# Patient Record
Sex: Female | Born: 1965 | Race: White | Hispanic: No | Marital: Single | Smoking: Never smoker
Health system: Southern US, Community
[De-identification: ages and names within clinical notes are randomized; demographics above are authoritative.]

## PROBLEM LIST (undated history)

## (undated) HISTORY — PX: APPENDECTOMY: SHX54

## (undated) HISTORY — PX: CHOLECYSTECTOMY: SHX55

## (undated) HISTORY — PX: BACK SURGERY: SHX140

---

## 2016-02-27 ENCOUNTER — Emergency Department (HOSPITAL_BASED_OUTPATIENT_CLINIC_OR_DEPARTMENT_OTHER): Payer: BLUE CROSS/BLUE SHIELD

## 2016-02-27 ENCOUNTER — Encounter (HOSPITAL_BASED_OUTPATIENT_CLINIC_OR_DEPARTMENT_OTHER): Payer: Self-pay | Admitting: Emergency Medicine

## 2016-02-27 ENCOUNTER — Emergency Department (HOSPITAL_BASED_OUTPATIENT_CLINIC_OR_DEPARTMENT_OTHER)
Admission: EM | Admit: 2016-02-27 | Discharge: 2016-02-27 | Disposition: A | Discharge status: Home / Self Care | Payer: BLUE CROSS/BLUE SHIELD | Attending: Emergency Medicine | Admitting: Emergency Medicine

## 2016-02-27 DIAGNOSIS — S61212A Laceration without foreign body of right middle finger without damage to nail, initial encounter: Secondary | ICD-10-CM | POA: Diagnosis not present

## 2016-02-27 DIAGNOSIS — S51832A Puncture wound without foreign body of left forearm, initial encounter: Secondary | ICD-10-CM | POA: Insufficient documentation

## 2016-02-27 DIAGNOSIS — Y999 Unspecified external cause status: Secondary | ICD-10-CM | POA: Insufficient documentation

## 2016-02-27 DIAGNOSIS — S51831A Puncture wound without foreign body of right forearm, initial encounter: Secondary | ICD-10-CM | POA: Insufficient documentation

## 2016-02-27 DIAGNOSIS — S61451A Open bite of right hand, initial encounter: Secondary | ICD-10-CM | POA: Diagnosis present

## 2016-02-27 DIAGNOSIS — Z23 Encounter for immunization: Secondary | ICD-10-CM | POA: Insufficient documentation

## 2016-02-27 DIAGNOSIS — W540XXA Bitten by dog, initial encounter: Secondary | ICD-10-CM | POA: Diagnosis not present

## 2016-02-27 DIAGNOSIS — T148XXA Other injury of unspecified body region, initial encounter: Secondary | ICD-10-CM

## 2016-02-27 DIAGNOSIS — Y929 Unspecified place or not applicable: Secondary | ICD-10-CM | POA: Insufficient documentation

## 2016-02-27 DIAGNOSIS — S80212A Abrasion, left knee, initial encounter: Secondary | ICD-10-CM | POA: Insufficient documentation

## 2016-02-27 DIAGNOSIS — Y9301 Activity, walking, marching and hiking: Secondary | ICD-10-CM | POA: Diagnosis not present

## 2016-02-27 DIAGNOSIS — S61219A Laceration without foreign body of unspecified finger without damage to nail, initial encounter: Secondary | ICD-10-CM

## 2016-02-27 MED ORDER — IBUPROFEN 800 MG PO TABS
800.0000 mg | ORAL_TABLET | Freq: Once | ORAL | Status: AC | PRN
  Administered 2016-02-27: 800 mg via ORAL
  Filled 2016-02-27: qty 1

## 2016-02-27 MED ORDER — TETANUS-DIPHTH-ACELL PERTUSSIS 5-2.5-18.5 LF-MCG/0.5 IM SUSP
0.5000 mL | Freq: Once | INTRAMUSCULAR | Status: AC
  Administered 2016-02-27: 0.5 mL via INTRAMUSCULAR
  Filled 2016-02-27: qty 0.5

## 2016-02-27 MED ORDER — LIDOCAINE HCL 2 % IJ SOLN
20.0000 mL | Freq: Once | INTRAMUSCULAR | Status: AC
  Administered 2016-02-27: 400 mg via INTRADERMAL
  Filled 2016-02-27: qty 20

## 2016-02-27 MED ORDER — AMOXICILLIN-POT CLAVULANATE 875-125 MG PO TABS
1.0000 | ORAL_TABLET | Freq: Once | ORAL | Status: AC
  Administered 2016-02-27: 1 via ORAL
  Filled 2016-02-27: qty 1

## 2016-02-27 MED ORDER — AMOXICILLIN-POT CLAVULANATE 875-125 MG PO TABS: 1.0000 | ORAL_TABLET | Freq: Two times a day (BID) | ORAL | Status: DC

## 2016-02-27 MED ORDER — TRAMADOL HCL 50 MG PO TABS: 50.0000 mg | ORAL_TABLET | Freq: Four times a day (QID) | ORAL | Status: DC | PRN

## 2016-02-27 NOTE — ED Notes (Signed)
HPPD at bedside for dogbite.

## 2016-02-27 NOTE — ED Notes (Signed)
Patient states that she was walking her dog and another dog attacked it. She jumped on top of her dog and the other dog attached her. The patient has multiple areas of injury

## 2016-02-27 NOTE — ED Notes (Signed)
All abrasions to BUE and B knee cleaned and dressed with bacitracin ointment and sterile 4x4's secured with coban. The rt middle finger and rt ring finger left open to air.

## 2016-02-27 NOTE — Discharge Instructions (Signed)
Please read and follow all provided instructions.  Your diagnoses today include:  1. Dog bite   2. Finger laceration, initial encounter     Tests performed today include:  X-ray of the affected area that did not show any foreign bodies or broken bones  Vital signs. See below for your results today.   Medications prescribed:   Augmentin - antibiotic  You have been prescribed an antibiotic medicine: take the entire course of medicine even if you are feeling better. Stopping early can cause the antibiotic not to work.  Take any prescribed medications only as directed.   Home care instructions:  Follow any educational materials and wound care instructions contained in this packet.   Keep affected area above the level of your heart when possible to minimize swelling. Wash area gently twice a day with warm soapy water. Do not apply alcohol or hydrogen peroxide. Cover the area if it draining or weeping.   Follow-up instructions: Suture Removal: Return to the Emergency Department or see your primary care care doctor in 7 days for a recheck of your wound and removal of your sutures or staples.    Return instructions:  Return to the Emergency Department if you have:  Fever  Worsening pain  Worsening swelling of the wound  Pus draining from the wound  Redness of the skin that moves away from the wound, especially if it streaks away from the affected area   Any other emergent concerns  Your vital signs today were: BP 146/87 mmHg   Pulse 78   Temp(Src) 97.7 F (36.5 C) (Oral)   Resp 18   Ht 5\' 5"  (1.651 m)   Wt 74.844 kg   BMI 27.46 kg/m2   SpO2 100%   LMP 02/01/2016 If your blood pressure (BP) was elevated above 135/85 this visit, please have this repeated by your doctor within one month. --------------

## 2016-02-27 NOTE — ED Notes (Signed)
Pt given d/c instructions as per chart. Verbalizes understanding. No questions. Rx x 2. 

## 2016-02-27 NOTE — ED Provider Notes (Signed)
CSN: [id number]     Arrival date & time 02/27/16  2016 History   First MD Initiated Contact with Patient 02/27/16 2116     Chief Complaint  Patient presents with  . Animal Bite     (Consider location/radiation/quality/duration/timing/severity/associated sxs/prior Treatment) HPI Comments: Patient presents with chief complaint of dog bite occurring just prior to arrival. Patient was walking her dog when another dog attacked her dog. Patient was bitten and scratched by the attacking dog when she tried to defend her own animal. Patient sustained abrasions to her knees when she scraped on the ground. She has puncture wounds to her bilateral forearms and a laceration of the tip of her right long finger. Tetanus greater than 5 years ago. No treatments prior to arrival. Dog is known and is up-to-date on its immunizations.  The history is provided by the patient.    History reviewed. No pertinent past medical history. Past Surgical History  Procedure Laterality Date  . Back surgery    . Cholecystectomy    . Appendectomy     History reviewed. No pertinent family history. Social History  Substance Use Topics  . Smoking status: Never Smoker   . Smokeless tobacco: None  . Alcohol Use: No   OB History    No data available     Review of Systems  Constitutional: Negative for activity change.  Musculoskeletal: Positive for myalgias and arthralgias. Negative for back pain, joint swelling and neck pain.  Skin: Positive for wound.  Neurological: Negative for weakness and numbness.   Allergies  Penicillins  Home Medications   Prior to Admission medications   Not on File   BP 146/85 mmHg  Pulse 100  Temp(Src) 98.8 F (37.1 C) (Oral)  Resp 18  Ht 5\' 5"  (1.651 m)  Wt 74.844 kg  BMI 27.46 kg/m2  SpO2 100%  LMP 02/01/2016   Physical Exam  Constitutional: She appears well-developed and well-nourished.  HENT:  Head: Normocephalic and atraumatic.  Eyes: Conjunctivae are normal.   Neck: Normal range of motion. Neck supple.  Pulmonary/Chest: No respiratory distress.  Neurological: She is alert.  Skin: Skin is warm and dry.  Patient with several small puncture wounds noted to the bilateral forearms. Unable to visualize the base of these wounds they are punctures. No active bleeding. They appear clean.  Abrasion noted to the left knee. Appears clean.   There is a 1 cm flap laceration noted to the pad of the right long finger.  Psychiatric: She has a normal mood and affect.  Nursing note and vitals reviewed.   ED Course  Procedures (including critical care time) Imaging Review Dg Hand Complete Right  02/27/2016  CLINICAL DATA:  Dog bite on right hand, with wound at the distal tip of the third phalanx. Initial encounter. EXAM: RIGHT HAND - COMPLETE 3+ VIEW COMPARISON:  None. FINDINGS: The known soft tissue laceration is not well characterized on radiograph. No radiopaque foreign bodies are seen. There is no evidence of osseous disruption. Visualized joint spaces are preserved. The carpal rows appear grossly intact, and demonstrate normal alignment. IMPRESSION: No evidence of fracture or dislocation. No radiopaque foreign bodies seen. Electronically Signed   By: Roanna RaiderJeffery  Chang M.D.   On: 02/27/2016 22:00   I have personally reviewed and evaluated these images and lab results as part of my medical decision-making.  Patient seen and examined. Wound is well cleaned and irrigated by nursing. X-ray ordered. Augmentin ordered. TD Ordered.   Vital signs reviewed and are  as follows: BP 146/87 mmHg  Pulse 78  Temp(Src) 97.7 F (36.5 C) (Oral)  Resp 18  Ht  (1.651 m)  Wt 74.844 kg  BMI 27.46 kg/m2  SpO2 100%  LMP 02/01/2016  Discussed with patient that finger wound likely should be repaired given gaping nature wound. We discussed risk of wound infection with animal bites as well. Augmentin given in emergency department without any allergic reaction. Tdap updated.  Patient agrees to proceed with wound repair.  Digital block performed.  Technique: 3-sided ring block Finger: R long Area prepped with alcohol wipe and iodine Medication: 3mL of 2% lidocaine without epinephrine Patient tolerated procedure well. Adequate anesthesia achieved.   LACERATION REPAIR Performed by: Carolee Rota Authorized by: Carolee Rota Consent: Verbal consent obtained. Risks and benefits: risks, benefits and alternatives were discussed Consent given by: patient Patient identity confirmed: provided demographic data Prepped and Draped in normal sterile fashion Wound explored  Laceration Location: Right long digit  Laceration Length: 1 cm  No Foreign Bodies seen or palpated  Anesthesia: Digital block, see above   Irrigation method: Scrub with dermal cleanser  Amount of cleaning: standard  Skin closure: 5-0 plain gut   Number of sutures: 4  Technique: Simple interrupted   Patient tolerance: Patient tolerated the procedure well with no immediate complications.  Patient counseled on wound care. Patient counseled on need to return or see PCP/urgent care for suture removal in 7 days. Patient was urged to return to the Emergency Department urgently with worsening pain, swelling, expanding erythema especially if it streaks away from the affected area, fever, or if they have any other concerns. Patient verbalized understanding.    MDM   Final diagnoses:  Finger laceration, initial encounter  Animal bite   Animal bite: Multiple small puncture wounds, cleaned. Hemostasis achieved. No wound closure as these are small punctures due to animal bite. No foreign bodies palpated. Finger laceration closed as pad was poorly approximated. This is not a puncture. Wound thoroughly cleaned prior to closure. Patient started on Augmentin. Also T-dap updated. Patient also has clean abrasions. We discussed good wound care and signs and symptoms to return. Patient seems reliable to  return with worsening. Dog is known and up-to-date on rabies.    Renne Crigler, PA-C 02/28/16 [id number]  Rolan Bucco, MD 02/28/16 [phone]

## 2016-03-04 ENCOUNTER — Emergency Department (HOSPITAL_BASED_OUTPATIENT_CLINIC_OR_DEPARTMENT_OTHER)
Admission: EM | Admit: 2016-03-04 | Discharge: 2016-03-04 | Disposition: A | Discharge status: Home / Self Care | Payer: BLUE CROSS/BLUE SHIELD | Attending: Emergency Medicine | Admitting: Emergency Medicine

## 2016-03-04 ENCOUNTER — Encounter (HOSPITAL_BASED_OUTPATIENT_CLINIC_OR_DEPARTMENT_OTHER): Payer: Self-pay | Admitting: *Deleted

## 2016-03-04 DIAGNOSIS — Z4802 Encounter for removal of sutures: Secondary | ICD-10-CM | POA: Diagnosis not present

## 2016-03-04 NOTE — ED Provider Notes (Signed)
CSN: [id number]     Arrival date & time 03/04/16  0711 History   First MD Initiated Contact with Patient 03/04/16 0720     No chief complaint on file.    (Consider location/radiation/quality/duration/timing/severity/associated sxs/prior Treatment) HPI Comments: The patient is a 50 year old female, she presents to have her sutures removed in her finger, right hand, occurred approximately 7 days ago when she was bitten by a dog. She has finished her Augmentin, she has some swelling of her right mid forearm but this has been chronically swollen since the dog bite. She denies any worsening of the swelling, denies any fevers, states she is doing well.  The history is provided by the patient.    No past medical history on file. Past Surgical History  Procedure Laterality Date  . Back surgery    . Cholecystectomy    . Appendectomy     No family history on file. Social History  Substance Use Topics  . Smoking status: Never Smoker   . Smokeless tobacco: Not on file  . Alcohol Use: No   OB History    No data available     Review of Systems  Constitutional: Negative for fever.  Skin: Positive for wound.      Allergies  Penicillins  Home Medications   Prior to Admission medications   Medication Sig Start Date End Date Taking? Authorizing Provider  amoxicillin-clavulanate (AUGMENTIN) 875-125 MG tablet Take 1 tablet by mouth 2 (two) times daily. 02/27/16   Renne CriglerJoshua Geiple, PA-C  traMADol (ULTRAM) 50 MG tablet Take 1 tablet (50 mg total) by mouth every 6 (six) hours as needed. 02/27/16   Renne CriglerJoshua Geiple, PA-C   BP 120/82 mmHg  Pulse 70  Temp(Src) 98.7 F (37.1 C) (Oral)  Resp 18  Ht 5\' 5"  (1.651 m)  Wt 165 lb (74.844 kg)  BMI 27.46 kg/m2  SpO2 100%  LMP 02/01/2016 Physical Exam  Constitutional: She appears well-developed and well-nourished.  HENT:  Head: Normocephalic and atraumatic.  Eyes: Conjunctivae are normal. Right eye exhibits no discharge. Left eye exhibits no  discharge.  Pulmonary/Chest: Effort normal. No respiratory distress.  Musculoskeletal:  Right forearm with minimal tenderness, bruising present to the right mid volar forearm, normal range of motion of all fingers, sutures present in the tip of the finger of the right hand  Neurological: She is alert. Coordination normal.  Skin: Skin is warm and dry. No rash noted. She is not diaphoretic. No erythema.  Psychiatric: She has a normal mood and affect.  Nursing note and vitals reviewed.   ED Course  Procedures (including critical care time) Labs Review Labs Reviewed - No data to display  Imaging Review No results found. I have personally reviewed and evaluated these images and lab results as part of my medical decision-making.   EKG Interpretation None      MDM   Final diagnoses:  Visit for suture removal    Sutures removed, vital signs stable, no signs of infection, encouraged good wound care, ice packs for swelling, follow-up as needed, patient expressed understanding.    Eber HongBrian Aiya Keach, MD 03/04/16 [phone]

## 2016-03-04 NOTE — ED Notes (Signed)
MD at bedside. 

## 2016-03-04 NOTE — ED Notes (Signed)
Sutures removed by EDP. 

## 2016-03-04 NOTE — Discharge Instructions (Signed)
Tylenol or motrin for pain - Ice packs for swelling Keep your wound dressed and clean when playing tennis

## 2016-03-04 NOTE — ED Notes (Signed)
Rt forearm area has swelling and discoloration, assessed also by EDP

## 2016-03-04 NOTE — ED Notes (Signed)
Here for wound ck, post dog bite and also for suture removal

## 2016-03-04 NOTE — ED Notes (Signed)
Suture site at rt hand finger area, wnl, no redness, no swelling, no drainage noted

## 2016-03-04 NOTE — ED Notes (Signed)
Ice packs provided. Pt teaching done re: wound care and using ice packs

## 2017-11-02 DIAGNOSIS — N951 Menopausal and female climacteric states: Secondary | ICD-10-CM | POA: Insufficient documentation

## 2019-04-22 DIAGNOSIS — R232 Flushing: Secondary | ICD-10-CM | POA: Insufficient documentation

## 2021-03-12 DIAGNOSIS — Z8249 Family history of ischemic heart disease and other diseases of the circulatory system: Secondary | ICD-10-CM | POA: Insufficient documentation

## 2021-04-13 DIAGNOSIS — R87619 Unspecified abnormal cytological findings in specimens from cervix uteri: Secondary | ICD-10-CM | POA: Insufficient documentation

## 2021-04-13 DIAGNOSIS — R7989 Other specified abnormal findings of blood chemistry: Secondary | ICD-10-CM | POA: Insufficient documentation

## 2022-01-26 ENCOUNTER — Telehealth: Payer: Self-pay | Admitting: Podiatry

## 2022-01-26 NOTE — Telephone Encounter (Signed)
Called patient lvm to schedule appt for patient to see podiatrist. Patient signed up at the [facility] fair ?

## 2022-02-04 ENCOUNTER — Ambulatory Visit (INDEPENDENT_AMBULATORY_CARE_PROVIDER_SITE_OTHER): Payer: BC Managed Care – PPO

## 2022-02-04 ENCOUNTER — Other Ambulatory Visit: Payer: Self-pay | Admitting: Podiatry

## 2022-02-04 ENCOUNTER — Encounter: Payer: Self-pay | Admitting: Podiatry

## 2022-02-04 ENCOUNTER — Ambulatory Visit (INDEPENDENT_AMBULATORY_CARE_PROVIDER_SITE_OTHER): Payer: BC Managed Care – PPO | Admitting: Podiatry

## 2022-02-04 DIAGNOSIS — M899 Disorder of bone, unspecified: Secondary | ICD-10-CM | POA: Diagnosis not present

## 2022-02-04 DIAGNOSIS — M949 Disorder of cartilage, unspecified: Secondary | ICD-10-CM

## 2022-02-04 DIAGNOSIS — M7751 Other enthesopathy of right foot: Secondary | ICD-10-CM

## 2022-02-04 MED ORDER — MELOXICAM 15 MG PO TABS: 15.0000 mg | ORAL_TABLET | Freq: Every day | ORAL | 0 refills | Status: DC

## 2022-02-04 NOTE — Progress Notes (Signed)
?  Subjective:  ?Patient ID: EULAR PANEK, female    DOB: 04-04-1966,   MRN: [id number] ? ?Chief Complaint  ?Patient presents with  ? Ankle Pain  ?  Ankle right - pain radiates around the whole ankle, swelling laterally, 2 bad injuries in her teens and mid 20's, been weak since, she has tried a brace in the past  ? New Patient (Initial Visit)  ? ? ?56 y.o. female presents for concern for right ankle and foot pain. Relates the pain radiates around the entire ankle. States she injured the ankle twice when she was a teenager and mid 69s . Relates she does notices weakness and rolls the ankle a lot. Relates swelling and has tried bracing . Relates a lot of the pain feels deep in the ankle and mostly hurts when she rolls it. Denies any other pedal complaints. Denies n/v/f/c.  ? ?History reviewed. No pertinent past medical history. ? ?Objective:  ?Physical Exam: ?Vascular: DP/PT pulses 2/4 bilateral. CFT <3 seconds. Normal hair growth on digits. No edema.  ?Skin. No lacerations or abrasions bilateral feet.  ?Musculoskeletal: MMT 5/5 bilateral lower extremities in DF, PF, Inversion and Eversion. Deceased ROM in DF of ankle joint. Some tenderness to anterior ankle joint line laterally, but no real pain today.  ?Neurological: Sensation intact to light touch.  ? ?Assessment:  ? ?1. Capsulitis of right ankle   ?2. Osteochondral lesion of talar dome   ? ? ? ?Plan:  ?Patient was evaluated and treated and all questions answered. ?Discussed OCD lesions and early arthritis of ankle with patient and treatment options.  ?X-rays reviewed. No acute fractures or dislocations. Small defect possible in lateral dome of the talus. No other significant degenerative changes noted to the ankle.   ?Discussed NSAIDS, topicals, and possible injections.  ?Prescription for meloxicam provided. ?Discussed bracing. Patient would like to use old brace she has at home or will get new one.   ?Discussed if pain does not improve can discuss surgical options  and likely MRI.  ?Patient to follow-up as needed.  ? ? ?Louann Sjogren, DPM  ? ? ?

## 2024-01-16 IMAGING — DX DG FOOT COMPLETE 3+V*R*
3 series · 3 of 3 positions shown · non-contrast
Comparison: None Available.

CLINICAL DATA: Atraumatic medial and anterior ankle pain.

EXAM:
RIGHT FOOT COMPLETE - 3+ VIEW

[foot ap wb]
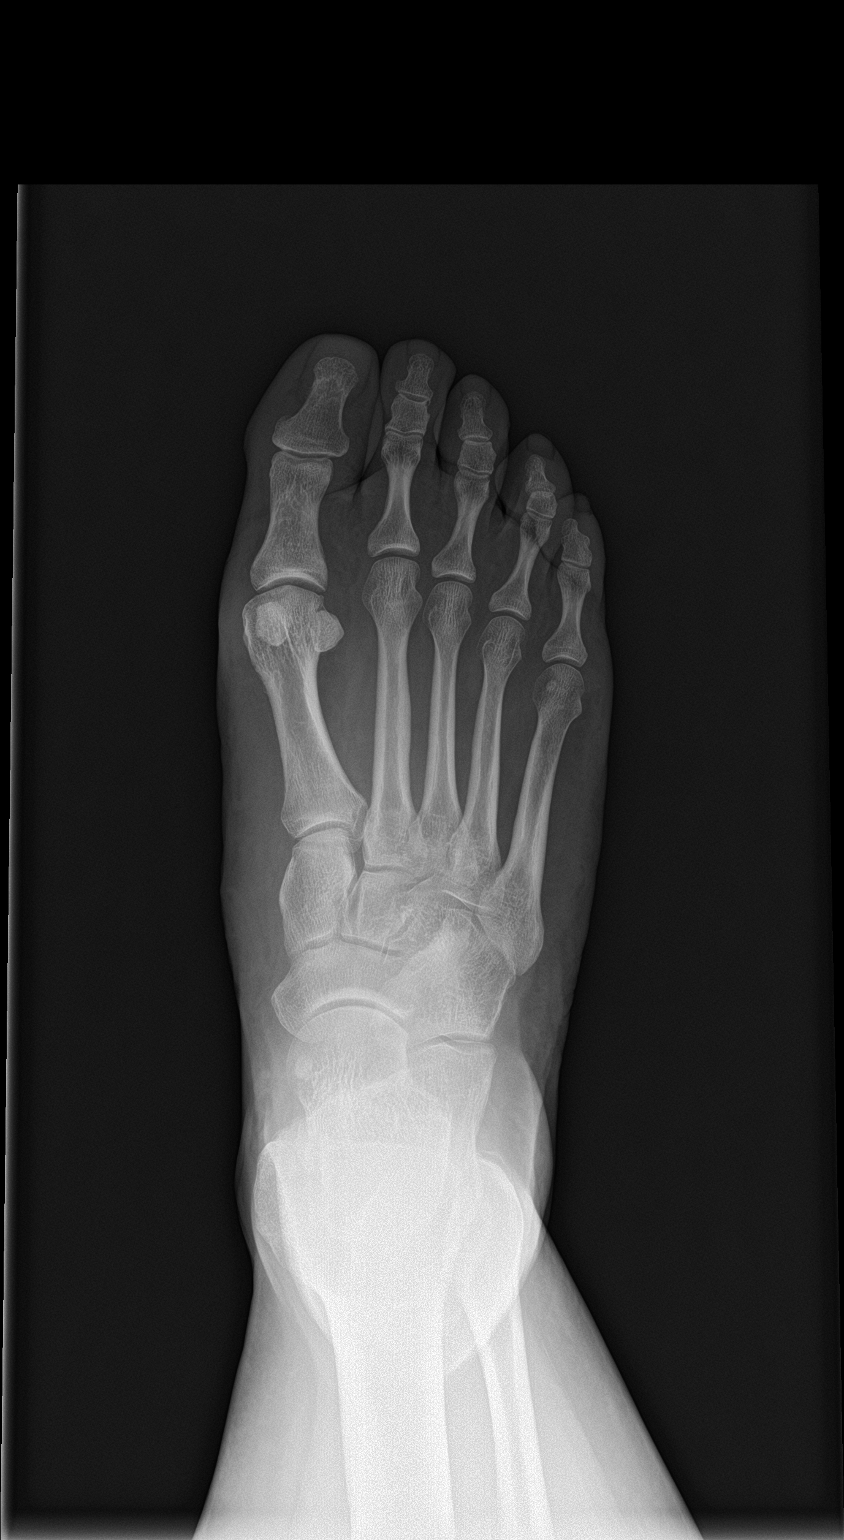

[foot obl wb]
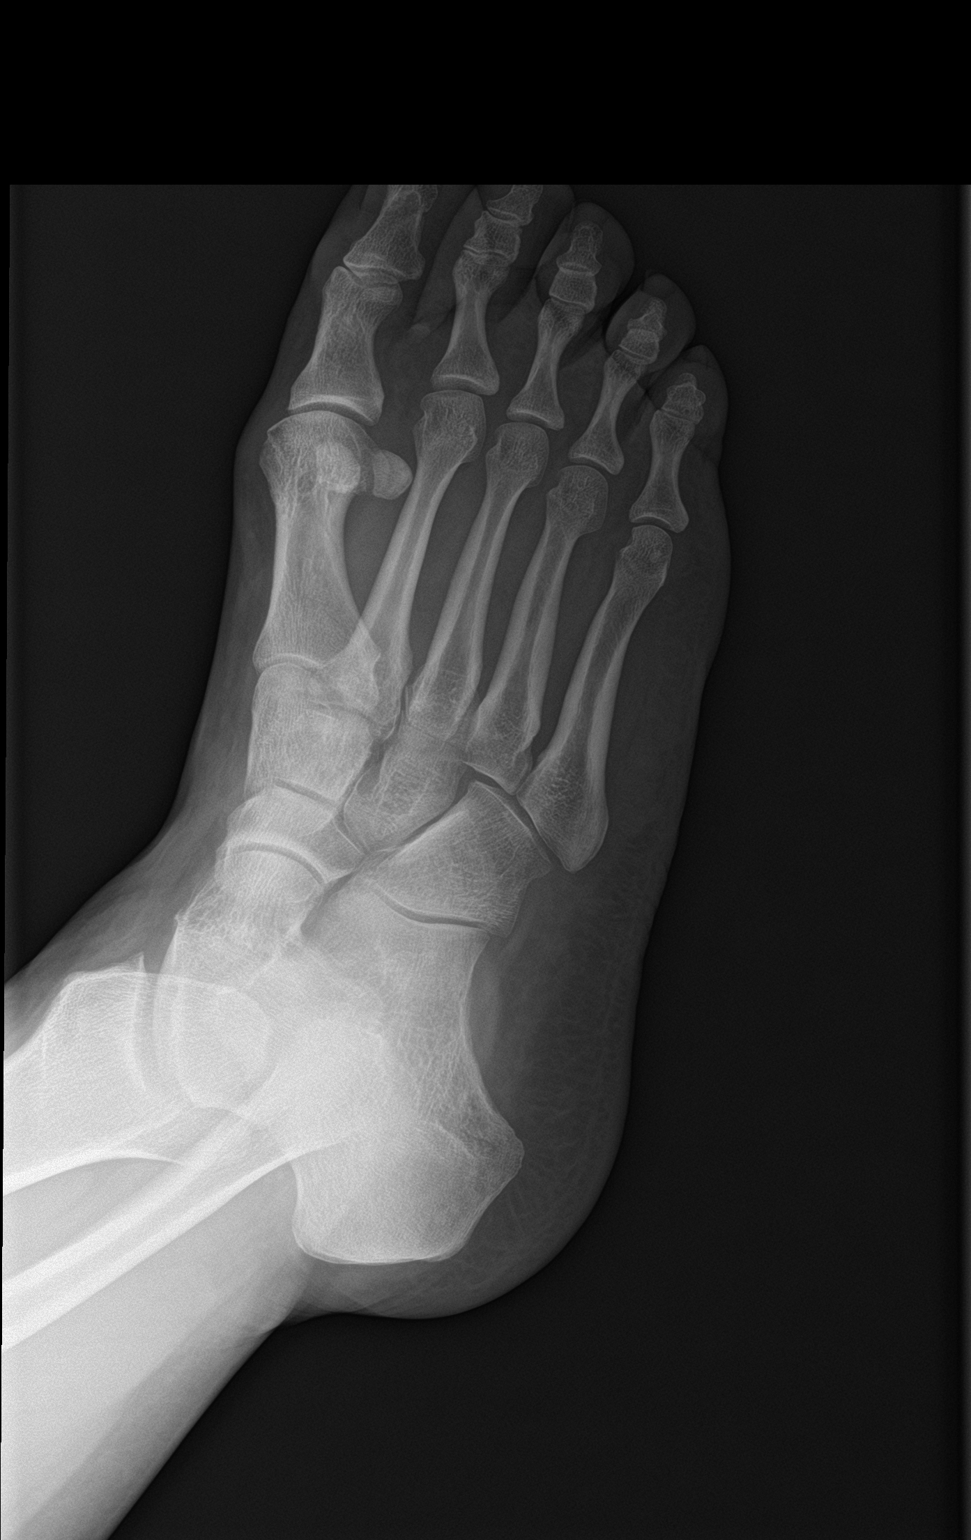

[foot lat wb]
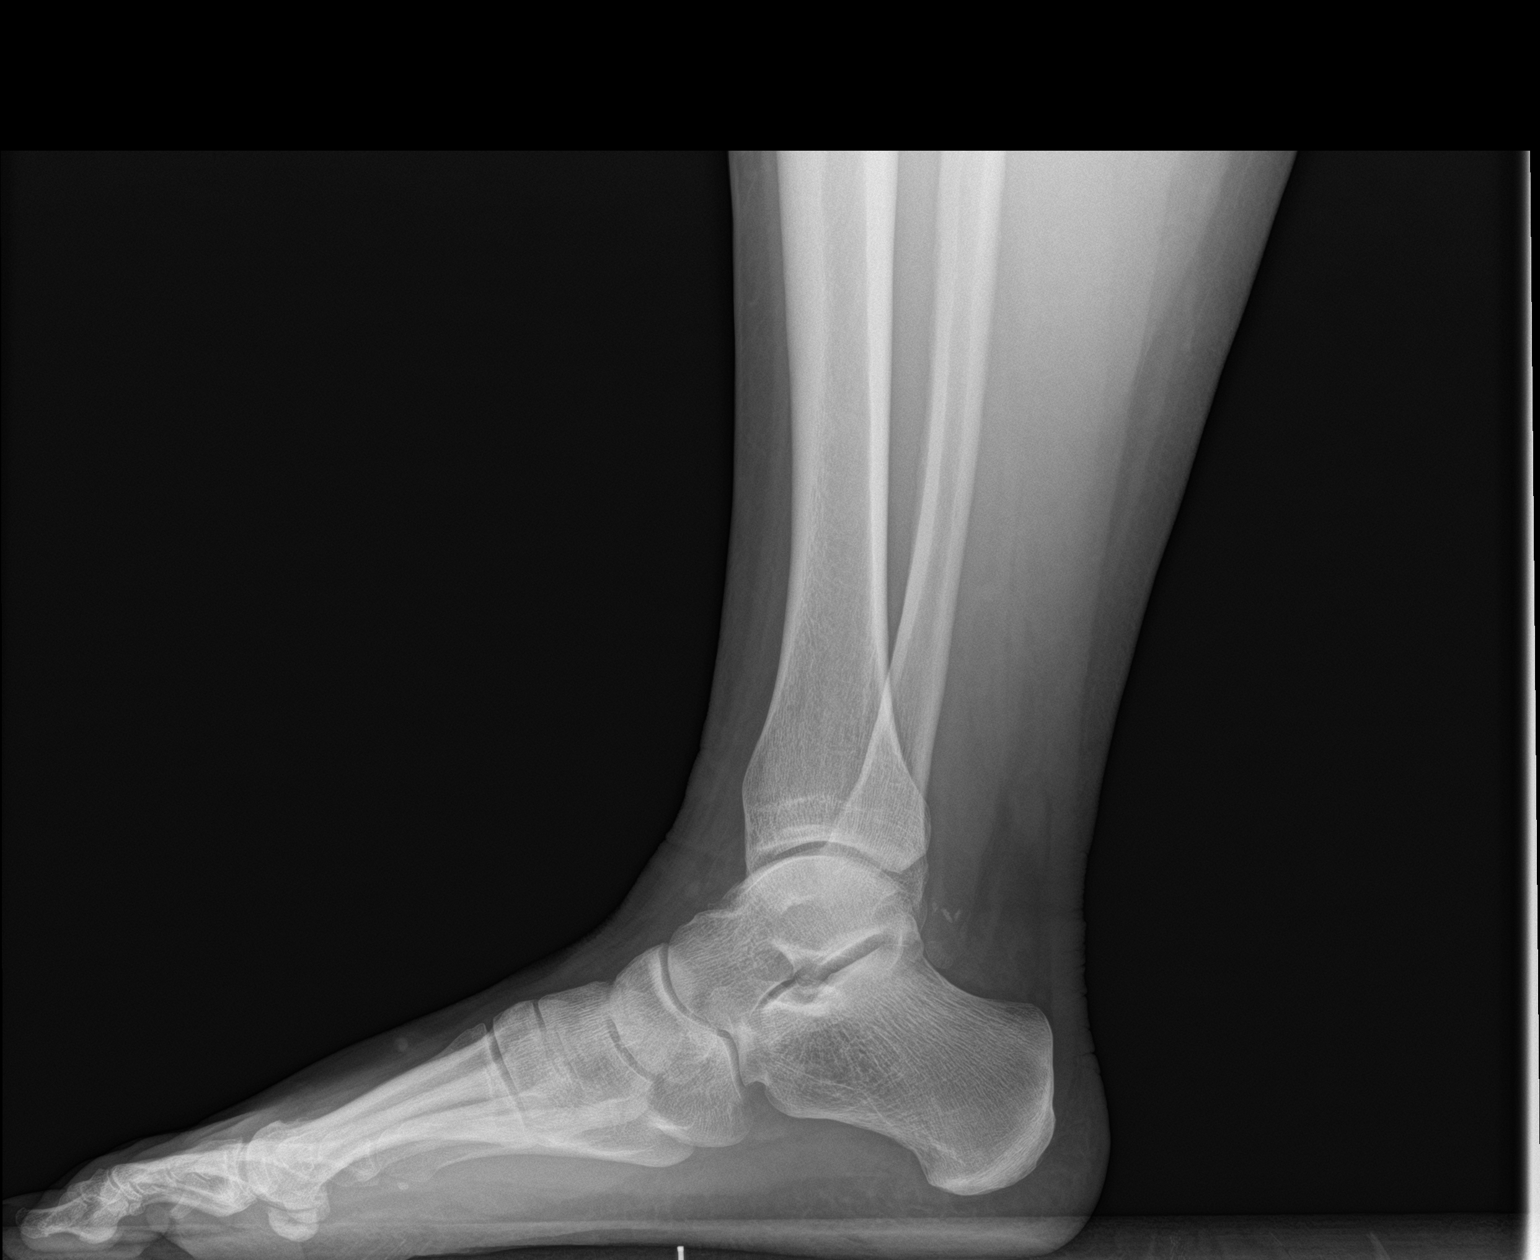

[3 of 3 positions shown; findings below may reference images not displayed]

FINDINGS: There is no evidence of fracture or dislocation. There is no
evidence of arthropathy or other focal bone abnormality. Soft
tissues are unremarkable.
IMPRESSION: Negative.
